# Patient Record
Sex: Female | Born: 2000 | Race: Black or African American | Hispanic: No | Marital: Single | Smoking: Never smoker
Health system: Southern US, Community
[De-identification: ages and names within clinical notes are randomized; demographics above are authoritative.]

---
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
## 2020-04-11 ENCOUNTER — Encounter: Payer: Self-pay | Admitting: *Deleted

## 2020-04-11 ENCOUNTER — Other Ambulatory Visit: Payer: Self-pay

## 2020-04-11 DIAGNOSIS — Z79899 Other long term (current) drug therapy: Secondary | ICD-10-CM | POA: Insufficient documentation

## 2020-04-11 DIAGNOSIS — Y93G1 Activity, food preparation and clean up: Secondary | ICD-10-CM | POA: Insufficient documentation

## 2020-04-11 DIAGNOSIS — Y999 Unspecified external cause status: Secondary | ICD-10-CM | POA: Diagnosis not present

## 2020-04-11 DIAGNOSIS — W260XXA Contact with knife, initial encounter: Secondary | ICD-10-CM | POA: Insufficient documentation

## 2020-04-11 DIAGNOSIS — S6992XA Unspecified injury of left wrist, hand and finger(s), initial encounter: Secondary | ICD-10-CM | POA: Diagnosis present

## 2020-04-11 DIAGNOSIS — Y929 Unspecified place or not applicable: Secondary | ICD-10-CM | POA: Diagnosis not present

## 2020-04-11 DIAGNOSIS — S61012A Laceration without foreign body of left thumb without damage to nail, initial encounter: Secondary | ICD-10-CM | POA: Insufficient documentation

## 2020-04-11 NOTE — ED Triage Notes (Signed)
To ED after cutting in between the thumb and pointer finger of the left hand. Bleeding under control at this time. Pt able to move finger but reports decreased sensation in the pad of her hand. Pt able to feel the tip of her thumb and pointer. Bleeding under control . New dressing applied.

## 2020-04-12 ENCOUNTER — Emergency Department
Admission: EM | Admit: 2020-04-12 | Discharge: 2020-04-12 | Disposition: A | Discharge status: Home / Self Care | Payer: 59 | Attending: Emergency Medicine | Admitting: Emergency Medicine

## 2020-04-12 DIAGNOSIS — S61012A Laceration without foreign body of left thumb without damage to nail, initial encounter: Secondary | ICD-10-CM

## 2020-04-12 NOTE — ED Notes (Signed)
Reviewed discharge instructions, follow-up care, and laceration care with patient. Patient verbalized understanding of all information reviewed. Patient stable, with no distress noted at this time.   ° °

## 2020-04-12 NOTE — ED Provider Notes (Signed)
[facility] Emergency Department Provider Note  ____________________________________________  Time seen: Approximately 4:51 AM  I have reviewed the triage vital signs and the nursing notes.   HISTORY  Chief Complaint Laceration   HPI Maria Rocha is a 19 y.o. female right-handed with no significant past medical history who presents for evaluation of a laceration.  Patient reports that she was trying to remove the pit of an avocado with a knife when the knife slipped and cut her at the base of her left thumb.  Tetanus is up-to-date.  She is complaining of sharp stinging throbbing pain which has been constant since it happened.  No other injuries.   History reviewed. No pertinent past medical history.  There are no problems to display for this patient.   History reviewed. No pertinent surgical history.  Prior to Admission medications   Medication Sig Start Date End Date Taking? Authorizing Provider  Multiple Vitamins-Minerals (MULTIVITAMIN WITH MINERALS) tablet Take 1 tablet by mouth daily.    [provider]    Allergies Patient has no known allergies.  Family History  Problem Relation Age of Onset  . Diabetes Other     Social History Social History   Tobacco Use  . Smoking status: Never Smoker  . Smokeless tobacco: Never Used  Substance Use Topics  . Alcohol use: No  . Drug use: No    Review of Systems  Constitutional: Negative for fever. Eyes: Negative for visual changes. ENT: Negative for sore throat. Neck: No neck pain  Cardiovascular: Negative for chest pain. Respiratory: Negative for shortness of breath. Gastrointestinal: Negative for abdominal pain, vomiting or diarrhea. Genitourinary: Negative for dysuria. Musculoskeletal: Negative for back pain. Skin: Negative for rash. + finger laceration Neurological: Negative for headaches, weakness or numbness. Psych: No SI or  HI  ____________________________________________   PHYSICAL EXAM:  VITAL SIGNS: ED Triage Vitals [04/11/20 2211]  Enc Vitals Group     BP (!) 134/92     Pulse Rate 98     Resp 16     Temp 98.2 F (36.8 C)     Temp Source Oral     SpO2 98 %     Weight 180 lb (81.6 kg)     Height 5' 4.5" (1.638 m)     Head Circumference      Peak Flow      Pain Score 7     Pain Loc      Pain Edu?      Excl. in [place]?     Constitutional: Alert and oriented. Well appearing and in no apparent distress. HEENT:      Head: Normocephalic and atraumatic.         Eyes: Conjunctivae are normal. Sclera is non-icteric.       Mouth/Throat: Mucous membranes are moist.       Neck: Supple with no signs of meningismus. Cardiovascular: Regular rate and rhythm.  Respiratory: Normal respiratory effort.  Musculoskeletal: Shallow linear laceration in the palmar aspect of the base of the L thumb, intact sensation distally, no tendon involvement, neurovascular intact Neurologic: Normal speech and language. Face is symmetric. Moving all extremities. No gross focal neurologic deficits are appreciated. Skin: Skin is warm, dry and intact. No rash noted. Psychiatric: Mood and affect are normal. Speech and behavior are normal.  ____________________________________________   LABS (all labs ordered are listed, but only abnormal results are displayed)  Labs Reviewed - No data to display ____________________________________________  EKG  none  ____________________________________________  RADIOLOGY  none  ____________________________________________   PROCEDURES  Procedure(s) performed:yes .Marland KitchenLaceration Repair  Date/Time: 04/12/2020 4:52 AM Performed by: Nita Sickle, MD Authorized by: Nita Sickle, MD   Consent:    Consent obtained:  Verbal   Consent given by:  Patient   Risks discussed:  Infection, pain, retained foreign body, poor cosmetic result and poor wound healing Anesthesia (see  MAR for exact dosages):    Anesthesia method:  None Laceration details:    Location:  Finger   Finger location:  L thumb   Length (cm):  1 Repair type:    Repair type:  Simple Exploration:    Hemostasis achieved with:  Direct pressure   Wound exploration: entire depth of wound probed and visualized     Wound extent: no foreign bodies/material noted, no tendon damage noted and no underlying fracture noted     Contaminated: no   Treatment:    Area cleansed with:  Saline   Amount of cleaning:  Extensive   Irrigation solution:  Sterile saline   Visualized foreign bodies/material removed: no   Skin repair:    Repair method:  Steri-Strips and tissue adhesive   Number of Steri-Strips:  3 Approximation:    Approximation:  Close Post-procedure details:    Dressing:  Sterile dressing   Patient tolerance of procedure:  Tolerated well, no immediate complications   Critical Care performed:  None ____________________________________________   INITIAL IMPRESSION / ASSESSMENT AND PLAN / ED COURSE  19 y.o. female right-handed with no significant past medical history who presents for evaluation of a laceration.  Laceration is very shallow and does not require any stitches.  Wound was washed thoroughly and inspected with no signs of tendon involvement.  Wound was repaired with Dermabond and Steri-Strips.  Discussed wound care with patient.  Tetanus shot is up-to-date.  Old medical records reviewed.      _____________________________________________ Please note:  Patient was evaluated in Emergency Department today for the symptoms described in the history of present illness. Patient was evaluated in the context of the global COVID-19 pandemic, which necessitated consideration that the patient might be at risk for infection with the SARS-CoV-2 virus that causes COVID-19. Institutional protocols and algorithms that pertain to the evaluation of patients at risk for COVID-19 are in a state of rapid  change based on information released by regulatory bodies including the CDC and federal and state organizations. These policies and algorithms were followed during the patient's care in the ED.  Some ED evaluations and interventions may be delayed as a result of limited staffing during the pandemic.   [place] Controlled Substance Database was reviewed by me. ____________________________________________   FINAL CLINICAL IMPRESSION(S) / ED DIAGNOSES   Final diagnoses:  Laceration of left thumb without foreign body without damage to nail, initial encounter      NEW MEDICATIONS STARTED DURING THIS VISIT:  ED Discharge Orders    None       Note:  This document was prepared using Dragon voice recognition software and may include unintentional dictation errors.    Don Perking, [place], MD 04/12/20 [phone]

## 2020-04-12 NOTE — Discharge Instructions (Signed)
Keep laceration dry and clean. Wash with warm water and soap. Apply topical bacitracin.  Watch for signs of infection: pus, redness of the skin surrounding it, or fever. If these develop see your doctor or return to the ER for antibiotics.

## 2020-09-30 ENCOUNTER — Ambulatory Visit: Payer: 59 | Attending: Family

## 2020-09-30 DIAGNOSIS — Z23 Encounter for immunization: Secondary | ICD-10-CM

## 2020-10-28 ENCOUNTER — Ambulatory Visit: Payer: 59 | Attending: Family

## 2020-10-28 DIAGNOSIS — Z23 Encounter for immunization: Secondary | ICD-10-CM

## 2020-11-27 NOTE — Progress Notes (Signed)
   Covid-19 Vaccination Clinic  Name:  Maria Rocha    MRN: [id number] DOB: [birth year]  11/27/2020  [name] was observed post Covid-19 immunization for 15 minutes without incident. She was provided with Vaccine Information Sheet and instruction to access the V-Safe system.   [name] was instructed to call 911 with any severe reactions post vaccine: Marland Kitchen Difficulty breathing  . Swelling of face and throat  . A fast heartbeat  . A bad rash all over body  . Dizziness and weakness   Immunizations Administered    Name Date Dose VIS Date Route   Moderna COVID-19 Vaccine 09/30/2020  3:30 PM 0.5 mL 09/18/2020 Intramuscular   Manufacturer: Moderna   Lot: [id number]   NDC: 26415-830-94

## 2021-02-04 NOTE — Progress Notes (Signed)
   Covid-19 Vaccination Clinic  Name:  Maria Rocha    MRN: [id number] DOB: Jan 11, 2001  02/04/2021  Maria Rocha was observed post Covid-19 immunization for 15 minutes without incident. She was provided with Vaccine Information Sheet and instruction to access the V-Safe system.   Maria Rocha was instructed to call 911 with any severe reactions post vaccine: Marland Kitchen Difficulty breathing  . Swelling of face and throat  . A fast heartbeat  . A bad rash all over body  . Dizziness and weakness   Immunizations Administered    Name Date Dose VIS Date Route   Moderna COVID-19 Vaccine 10/28/2020  4:53 PM 0.5 mL 09/18/2020 Intramuscular   Manufacturer: Moderna   Lot: [id number]   NDC: 84536-468-03
